# Patient Record
Sex: Male | Born: 1945 | Hispanic: No | Marital: Married | Smoking: Former smoker
Health system: Southern US, Community
[De-identification: ages and names within clinical notes are randomized; demographics above are authoritative.]

---

## 1998-08-18 ENCOUNTER — Other Ambulatory Visit: Admission: RE | Admit: 1998-08-18 | Discharge: 1998-08-18 | Payer: Self-pay | Admitting: Urology

## 2017-11-27 DIAGNOSIS — M25551 Pain in right hip: Secondary | ICD-10-CM | POA: Diagnosis not present

## 2017-11-27 DIAGNOSIS — M79601 Pain in right arm: Secondary | ICD-10-CM | POA: Diagnosis not present

## 2017-11-28 DIAGNOSIS — M25559 Pain in unspecified hip: Secondary | ICD-10-CM | POA: Diagnosis not present

## 2017-11-28 DIAGNOSIS — R319 Hematuria, unspecified: Secondary | ICD-10-CM | POA: Diagnosis not present

## 2017-12-02 DIAGNOSIS — N401 Enlarged prostate with lower urinary tract symptoms: Secondary | ICD-10-CM | POA: Diagnosis not present

## 2017-12-02 DIAGNOSIS — R3912 Poor urinary stream: Secondary | ICD-10-CM | POA: Diagnosis not present

## 2017-12-02 DIAGNOSIS — Z79899 Other long term (current) drug therapy: Secondary | ICD-10-CM | POA: Diagnosis not present

## 2017-12-02 DIAGNOSIS — R310 Gross hematuria: Secondary | ICD-10-CM | POA: Diagnosis not present

## 2017-12-02 DIAGNOSIS — N410 Acute prostatitis: Secondary | ICD-10-CM | POA: Diagnosis not present

## 2017-12-02 DIAGNOSIS — R350 Frequency of micturition: Secondary | ICD-10-CM | POA: Diagnosis not present

## 2017-12-02 DIAGNOSIS — N419 Inflammatory disease of prostate, unspecified: Secondary | ICD-10-CM | POA: Diagnosis not present

## 2017-12-02 DIAGNOSIS — N5089 Other specified disorders of the male genital organs: Secondary | ICD-10-CM | POA: Diagnosis not present

## 2017-12-04 DIAGNOSIS — M79601 Pain in right arm: Secondary | ICD-10-CM | POA: Diagnosis not present

## 2017-12-04 DIAGNOSIS — M25551 Pain in right hip: Secondary | ICD-10-CM | POA: Diagnosis not present

## 2017-12-12 DIAGNOSIS — M25551 Pain in right hip: Secondary | ICD-10-CM | POA: Diagnosis not present

## 2017-12-12 DIAGNOSIS — M79601 Pain in right arm: Secondary | ICD-10-CM | POA: Diagnosis not present

## 2017-12-19 DIAGNOSIS — H43813 Vitreous degeneration, bilateral: Secondary | ICD-10-CM | POA: Diagnosis not present

## 2017-12-19 DIAGNOSIS — H5213 Myopia, bilateral: Secondary | ICD-10-CM | POA: Diagnosis not present

## 2017-12-19 DIAGNOSIS — H25813 Combined forms of age-related cataract, bilateral: Secondary | ICD-10-CM | POA: Diagnosis not present

## 2017-12-19 DIAGNOSIS — H35373 Puckering of macula, bilateral: Secondary | ICD-10-CM | POA: Diagnosis not present

## 2018-07-11 ENCOUNTER — Other Ambulatory Visit: Payer: Self-pay | Admitting: Family Medicine

## 2018-07-11 DIAGNOSIS — Z87891 Personal history of nicotine dependence: Secondary | ICD-10-CM

## 2018-11-07 ENCOUNTER — Other Ambulatory Visit: Payer: Self-pay | Admitting: Family Medicine

## 2018-11-07 DIAGNOSIS — Z87891 Personal history of nicotine dependence: Secondary | ICD-10-CM

## 2019-06-14 ENCOUNTER — Ambulatory Visit: Payer: Medicare PPO | Attending: Internal Medicine

## 2019-06-14 DIAGNOSIS — Z23 Encounter for immunization: Secondary | ICD-10-CM

## 2019-06-14 NOTE — Progress Notes (Signed)
   Covid-19 Vaccination Clinic  Name:  Victor Campbell    MRN: [id number] DOB: [birth year]  06/14/2019  Victor Campbell was observed post Covid-19 immunization for 15 minutes without incidence. He was provided with Vaccine Information Sheet and instruction to access the V-Safe system.   Victor Campbell was instructed to call 911 with any severe reactions post vaccine: Marland Kitchen Difficulty breathing  . Swelling of your face and throat  . A fast heartbeat  . A bad rash all over your body  . Dizziness and weakness    Immunizations Administered    Name Date Dose VIS Date Route   Pfizer COVID-19 Vaccine 06/14/2019  8:31 AM 0.3 mL 04/03/2019 Intramuscular   Manufacturer: [employer], [employer]   Lot: [id number]   NDC: [address]-5

## 2019-07-08 ENCOUNTER — Ambulatory Visit: Payer: Medicare PPO | Attending: Internal Medicine

## 2019-07-08 DIAGNOSIS — Z23 Encounter for immunization: Secondary | ICD-10-CM

## 2019-07-08 NOTE — Progress Notes (Signed)
   Covid-19 Vaccination Clinic  Name:  Teresa Lemmerman    MRN: [id number] DOB: 18-Dec-1945  07/08/2019  Mr. Ra was observed post Covid-19 immunization for 15 minutes without incident. He was provided with Vaccine Information Sheet and instruction to access the V-Safe system.   Mr. Deriso was instructed to call 911 with any severe reactions post vaccine: Marland Kitchen Difficulty breathing  . Swelling of face and throat  . A fast heartbeat  . A bad rash all over body  . Dizziness and weakness   Immunizations Administered    Name Date Dose VIS Date Route   Pfizer COVID-19 Vaccine 07/08/2019  8:26 AM 0.3 mL 04/03/2019 Intramuscular   Manufacturer: [employer], [employer]   Lot: [id number]   NDC: [address]-9

## 2019-10-16 DIAGNOSIS — R319 Hematuria, unspecified: Secondary | ICD-10-CM | POA: Diagnosis not present

## 2019-10-16 DIAGNOSIS — N138 Other obstructive and reflux uropathy: Secondary | ICD-10-CM | POA: Diagnosis not present

## 2019-10-16 DIAGNOSIS — N401 Enlarged prostate with lower urinary tract symptoms: Secondary | ICD-10-CM | POA: Diagnosis not present

## 2019-10-16 DIAGNOSIS — R310 Gross hematuria: Secondary | ICD-10-CM | POA: Diagnosis not present

## 2019-10-16 DIAGNOSIS — R338 Other retention of urine: Secondary | ICD-10-CM | POA: Diagnosis not present

## 2019-10-21 DIAGNOSIS — Z03818 Encounter for observation for suspected exposure to other biological agents ruled out: Secondary | ICD-10-CM | POA: Diagnosis not present

## 2019-11-13 DIAGNOSIS — Z20822 Contact with and (suspected) exposure to covid-19: Secondary | ICD-10-CM | POA: Diagnosis not present

## 2019-11-25 DIAGNOSIS — M25551 Pain in right hip: Secondary | ICD-10-CM | POA: Diagnosis not present

## 2019-11-25 DIAGNOSIS — M25561 Pain in right knee: Secondary | ICD-10-CM | POA: Diagnosis not present

## 2019-11-25 DIAGNOSIS — M79601 Pain in right arm: Secondary | ICD-10-CM | POA: Diagnosis not present

## 2019-11-25 DIAGNOSIS — M25562 Pain in left knee: Secondary | ICD-10-CM | POA: Diagnosis not present

## 2019-12-04 DIAGNOSIS — M25562 Pain in left knee: Secondary | ICD-10-CM | POA: Diagnosis not present

## 2019-12-04 DIAGNOSIS — M25561 Pain in right knee: Secondary | ICD-10-CM | POA: Diagnosis not present

## 2019-12-04 DIAGNOSIS — M25551 Pain in right hip: Secondary | ICD-10-CM | POA: Diagnosis not present

## 2019-12-04 DIAGNOSIS — M79601 Pain in right arm: Secondary | ICD-10-CM | POA: Diagnosis not present

## 2019-12-11 DIAGNOSIS — M79601 Pain in right arm: Secondary | ICD-10-CM | POA: Diagnosis not present

## 2019-12-11 DIAGNOSIS — M25561 Pain in right knee: Secondary | ICD-10-CM | POA: Diagnosis not present

## 2019-12-11 DIAGNOSIS — M25551 Pain in right hip: Secondary | ICD-10-CM | POA: Diagnosis not present

## 2019-12-11 DIAGNOSIS — M25562 Pain in left knee: Secondary | ICD-10-CM | POA: Diagnosis not present

## 2019-12-25 DIAGNOSIS — M25551 Pain in right hip: Secondary | ICD-10-CM | POA: Diagnosis not present

## 2019-12-25 DIAGNOSIS — M25562 Pain in left knee: Secondary | ICD-10-CM | POA: Diagnosis not present

## 2019-12-25 DIAGNOSIS — M25561 Pain in right knee: Secondary | ICD-10-CM | POA: Diagnosis not present

## 2019-12-25 DIAGNOSIS — M79601 Pain in right arm: Secondary | ICD-10-CM | POA: Diagnosis not present

## 2019-12-31 DIAGNOSIS — H31003 Unspecified chorioretinal scars, bilateral: Secondary | ICD-10-CM | POA: Diagnosis not present

## 2019-12-31 DIAGNOSIS — H35373 Puckering of macula, bilateral: Secondary | ICD-10-CM | POA: Diagnosis not present

## 2019-12-31 DIAGNOSIS — H524 Presbyopia: Secondary | ICD-10-CM | POA: Diagnosis not present

## 2019-12-31 DIAGNOSIS — H25813 Combined forms of age-related cataract, bilateral: Secondary | ICD-10-CM | POA: Diagnosis not present

## 2020-01-01 DIAGNOSIS — M25562 Pain in left knee: Secondary | ICD-10-CM | POA: Diagnosis not present

## 2020-01-01 DIAGNOSIS — M25551 Pain in right hip: Secondary | ICD-10-CM | POA: Diagnosis not present

## 2020-01-01 DIAGNOSIS — M25561 Pain in right knee: Secondary | ICD-10-CM | POA: Diagnosis not present

## 2020-01-01 DIAGNOSIS — M79601 Pain in right arm: Secondary | ICD-10-CM | POA: Diagnosis not present

## 2020-01-08 DIAGNOSIS — M25561 Pain in right knee: Secondary | ICD-10-CM | POA: Diagnosis not present

## 2020-01-08 DIAGNOSIS — M25551 Pain in right hip: Secondary | ICD-10-CM | POA: Diagnosis not present

## 2020-01-08 DIAGNOSIS — M79601 Pain in right arm: Secondary | ICD-10-CM | POA: Diagnosis not present

## 2020-01-08 DIAGNOSIS — M25562 Pain in left knee: Secondary | ICD-10-CM | POA: Diagnosis not present

## 2020-01-15 DIAGNOSIS — M79601 Pain in right arm: Secondary | ICD-10-CM | POA: Diagnosis not present

## 2020-01-15 DIAGNOSIS — M25562 Pain in left knee: Secondary | ICD-10-CM | POA: Diagnosis not present

## 2020-01-15 DIAGNOSIS — M25561 Pain in right knee: Secondary | ICD-10-CM | POA: Diagnosis not present

## 2020-01-15 DIAGNOSIS — M25551 Pain in right hip: Secondary | ICD-10-CM | POA: Diagnosis not present

## 2020-01-18 DIAGNOSIS — Z03818 Encounter for observation for suspected exposure to other biological agents ruled out: Secondary | ICD-10-CM | POA: Diagnosis not present

## 2020-01-18 DIAGNOSIS — Z20822 Contact with and (suspected) exposure to covid-19: Secondary | ICD-10-CM | POA: Diagnosis not present

## 2020-02-05 DIAGNOSIS — M25561 Pain in right knee: Secondary | ICD-10-CM | POA: Diagnosis not present

## 2020-02-05 DIAGNOSIS — M79601 Pain in right arm: Secondary | ICD-10-CM | POA: Diagnosis not present

## 2020-02-05 DIAGNOSIS — M25551 Pain in right hip: Secondary | ICD-10-CM | POA: Diagnosis not present

## 2020-02-05 DIAGNOSIS — M25562 Pain in left knee: Secondary | ICD-10-CM | POA: Diagnosis not present

## 2020-02-19 DIAGNOSIS — M79601 Pain in right arm: Secondary | ICD-10-CM | POA: Diagnosis not present

## 2020-02-19 DIAGNOSIS — M25561 Pain in right knee: Secondary | ICD-10-CM | POA: Diagnosis not present

## 2020-02-19 DIAGNOSIS — M25562 Pain in left knee: Secondary | ICD-10-CM | POA: Diagnosis not present

## 2020-02-19 DIAGNOSIS — M25551 Pain in right hip: Secondary | ICD-10-CM | POA: Diagnosis not present

## 2020-02-20 ENCOUNTER — Ambulatory Visit: Payer: Medicare PPO | Attending: Internal Medicine

## 2020-02-20 DIAGNOSIS — Z23 Encounter for immunization: Secondary | ICD-10-CM

## 2020-02-20 NOTE — Progress Notes (Signed)
   Covid-19 Vaccination Clinic  Name:  Victor Campbell    MRN: [id number] DOB: [birth year]  02/20/2020  [name] was observed post Covid-19 immunization for 15 minutes without incident. He was provided with Vaccine Information Sheet and instruction to access the V-Safe system.   [name] was instructed to call 911 with any severe reactions post vaccine: Marland Kitchen Difficulty breathing  . Swelling of face and throat  . A fast heartbeat  . A bad rash all over body  . Dizziness and weakness

## 2020-02-26 DIAGNOSIS — M25561 Pain in right knee: Secondary | ICD-10-CM | POA: Diagnosis not present

## 2020-02-26 DIAGNOSIS — M25551 Pain in right hip: Secondary | ICD-10-CM | POA: Diagnosis not present

## 2020-02-26 DIAGNOSIS — M79601 Pain in right arm: Secondary | ICD-10-CM | POA: Diagnosis not present

## 2020-02-26 DIAGNOSIS — M25562 Pain in left knee: Secondary | ICD-10-CM | POA: Diagnosis not present

## 2020-03-04 DIAGNOSIS — M79601 Pain in right arm: Secondary | ICD-10-CM | POA: Diagnosis not present

## 2020-03-04 DIAGNOSIS — M25562 Pain in left knee: Secondary | ICD-10-CM | POA: Diagnosis not present

## 2020-03-04 DIAGNOSIS — M25551 Pain in right hip: Secondary | ICD-10-CM | POA: Diagnosis not present

## 2020-03-04 DIAGNOSIS — M25561 Pain in right knee: Secondary | ICD-10-CM | POA: Diagnosis not present

## 2020-04-07 DIAGNOSIS — N138 Other obstructive and reflux uropathy: Secondary | ICD-10-CM | POA: Diagnosis not present

## 2020-04-07 DIAGNOSIS — N401 Enlarged prostate with lower urinary tract symptoms: Secondary | ICD-10-CM | POA: Diagnosis not present

## 2020-04-07 DIAGNOSIS — R310 Gross hematuria: Secondary | ICD-10-CM | POA: Diagnosis not present

## 2020-07-15 DIAGNOSIS — I10 Essential (primary) hypertension: Secondary | ICD-10-CM | POA: Diagnosis not present

## 2020-07-15 DIAGNOSIS — E039 Hypothyroidism, unspecified: Secondary | ICD-10-CM | POA: Diagnosis not present

## 2020-07-15 DIAGNOSIS — Z125 Encounter for screening for malignant neoplasm of prostate: Secondary | ICD-10-CM | POA: Diagnosis not present

## 2020-07-15 DIAGNOSIS — E782 Mixed hyperlipidemia: Secondary | ICD-10-CM | POA: Diagnosis not present

## 2020-07-21 DIAGNOSIS — E782 Mixed hyperlipidemia: Secondary | ICD-10-CM | POA: Diagnosis not present

## 2020-07-21 DIAGNOSIS — L719 Rosacea, unspecified: Secondary | ICD-10-CM | POA: Diagnosis not present

## 2020-07-21 DIAGNOSIS — R972 Elevated prostate specific antigen [PSA]: Secondary | ICD-10-CM | POA: Diagnosis not present

## 2020-07-21 DIAGNOSIS — Z0000 Encounter for general adult medical examination without abnormal findings: Secondary | ICD-10-CM | POA: Diagnosis not present

## 2020-07-21 DIAGNOSIS — I10 Essential (primary) hypertension: Secondary | ICD-10-CM | POA: Diagnosis not present

## 2020-07-21 DIAGNOSIS — E039 Hypothyroidism, unspecified: Secondary | ICD-10-CM | POA: Diagnosis not present

## 2020-09-01 DIAGNOSIS — R002 Palpitations: Secondary | ICD-10-CM | POA: Diagnosis not present

## 2020-09-01 DIAGNOSIS — M2550 Pain in unspecified joint: Secondary | ICD-10-CM | POA: Diagnosis not present

## 2020-09-20 DIAGNOSIS — I499 Cardiac arrhythmia, unspecified: Secondary | ICD-10-CM | POA: Diagnosis not present

## 2020-09-21 DIAGNOSIS — R310 Gross hematuria: Secondary | ICD-10-CM | POA: Diagnosis not present

## 2020-09-26 DIAGNOSIS — R350 Frequency of micturition: Secondary | ICD-10-CM | POA: Diagnosis not present

## 2020-09-26 DIAGNOSIS — N401 Enlarged prostate with lower urinary tract symptoms: Secondary | ICD-10-CM | POA: Diagnosis not present

## 2020-09-26 DIAGNOSIS — M25561 Pain in right knee: Secondary | ICD-10-CM | POA: Diagnosis not present

## 2020-09-26 DIAGNOSIS — M25551 Pain in right hip: Secondary | ICD-10-CM | POA: Diagnosis not present

## 2020-09-26 DIAGNOSIS — M79601 Pain in right arm: Secondary | ICD-10-CM | POA: Diagnosis not present

## 2020-09-26 DIAGNOSIS — Z79899 Other long term (current) drug therapy: Secondary | ICD-10-CM | POA: Diagnosis not present

## 2020-09-26 DIAGNOSIS — R310 Gross hematuria: Secondary | ICD-10-CM | POA: Diagnosis not present

## 2020-09-26 DIAGNOSIS — M25562 Pain in left knee: Secondary | ICD-10-CM | POA: Diagnosis not present

## 2020-10-12 DIAGNOSIS — M79601 Pain in right arm: Secondary | ICD-10-CM | POA: Diagnosis not present

## 2020-10-12 DIAGNOSIS — M25562 Pain in left knee: Secondary | ICD-10-CM | POA: Diagnosis not present

## 2020-10-12 DIAGNOSIS — M25561 Pain in right knee: Secondary | ICD-10-CM | POA: Diagnosis not present

## 2020-10-12 DIAGNOSIS — M25551 Pain in right hip: Secondary | ICD-10-CM | POA: Diagnosis not present

## 2020-10-19 DIAGNOSIS — M25551 Pain in right hip: Secondary | ICD-10-CM | POA: Diagnosis not present

## 2020-10-19 DIAGNOSIS — M25561 Pain in right knee: Secondary | ICD-10-CM | POA: Diagnosis not present

## 2020-10-19 DIAGNOSIS — M79601 Pain in right arm: Secondary | ICD-10-CM | POA: Diagnosis not present

## 2020-10-19 DIAGNOSIS — M25562 Pain in left knee: Secondary | ICD-10-CM | POA: Diagnosis not present

## 2020-11-02 DIAGNOSIS — M25551 Pain in right hip: Secondary | ICD-10-CM | POA: Diagnosis not present

## 2020-11-02 DIAGNOSIS — M25561 Pain in right knee: Secondary | ICD-10-CM | POA: Diagnosis not present

## 2020-11-02 DIAGNOSIS — M25562 Pain in left knee: Secondary | ICD-10-CM | POA: Diagnosis not present

## 2020-11-02 DIAGNOSIS — M79601 Pain in right arm: Secondary | ICD-10-CM | POA: Diagnosis not present

## 2020-11-10 DIAGNOSIS — N400 Enlarged prostate without lower urinary tract symptoms: Secondary | ICD-10-CM | POA: Diagnosis not present

## 2020-11-10 DIAGNOSIS — Z9079 Acquired absence of other genital organ(s): Secondary | ICD-10-CM | POA: Diagnosis not present

## 2020-11-10 DIAGNOSIS — Z87438 Personal history of other diseases of male genital organs: Secondary | ICD-10-CM | POA: Diagnosis not present

## 2020-11-10 DIAGNOSIS — N3289 Other specified disorders of bladder: Secondary | ICD-10-CM | POA: Diagnosis not present

## 2020-11-10 DIAGNOSIS — Z09 Encounter for follow-up examination after completed treatment for conditions other than malignant neoplasm: Secondary | ICD-10-CM | POA: Diagnosis not present

## 2020-11-10 DIAGNOSIS — R310 Gross hematuria: Secondary | ICD-10-CM | POA: Diagnosis not present

## 2020-11-18 ENCOUNTER — Telehealth: Payer: Self-pay | Admitting: Interventional Cardiology

## 2020-11-18 NOTE — Telephone Encounter (Signed)
Victor Campbell with [facility] is calling in regards to getting this pt a sooner appt for palpatations advised first available 02/2021 and requested a message be sent to a nurse

## 2020-11-18 NOTE — Telephone Encounter (Signed)
Spoke with Britta Mccreedy at [facility] who states that the patient prefers to see Dr. Eldridge Dace. Advised that there were no openings with him sooner than the patient's already scheduled appointment for 10/5. Advised that we could get him in sooner if he is willing to see another provider. Britta Mccreedy states she will reach out to the patient to see what he wants to do.

## 2021-01-09 ENCOUNTER — Other Ambulatory Visit (INDEPENDENT_AMBULATORY_CARE_PROVIDER_SITE_OTHER): Payer: Medicare PPO

## 2021-01-09 ENCOUNTER — Telehealth: Payer: Self-pay

## 2021-01-09 DIAGNOSIS — R002 Palpitations: Secondary | ICD-10-CM | POA: Diagnosis not present

## 2021-01-09 NOTE — Progress Notes (Unsigned)
Monitor from office inventory applied in office.

## 2021-01-09 NOTE — Telephone Encounter (Signed)
Pt walked in for his 14 day Zio.Marland Kitchen order placed per Dr. Eldridge Dace and pt is having it put on here at the office now.

## 2021-01-12 DIAGNOSIS — H43813 Vitreous degeneration, bilateral: Secondary | ICD-10-CM | POA: Diagnosis not present

## 2021-01-12 DIAGNOSIS — H524 Presbyopia: Secondary | ICD-10-CM | POA: Diagnosis not present

## 2021-01-12 DIAGNOSIS — H35373 Puckering of macula, bilateral: Secondary | ICD-10-CM | POA: Diagnosis not present

## 2021-01-12 DIAGNOSIS — H2513 Age-related nuclear cataract, bilateral: Secondary | ICD-10-CM | POA: Diagnosis not present

## 2021-01-19 DIAGNOSIS — R002 Palpitations: Secondary | ICD-10-CM | POA: Diagnosis not present

## 2021-01-25 ENCOUNTER — Encounter: Payer: Self-pay | Admitting: Interventional Cardiology

## 2021-01-25 ENCOUNTER — Other Ambulatory Visit: Payer: Self-pay

## 2021-01-25 ENCOUNTER — Ambulatory Visit: Payer: Medicare PPO | Admitting: Interventional Cardiology

## 2021-01-25 VITALS — BP 114/80 | HR 70 | Ht 71.0 in | Wt 184.8 lb

## 2021-01-25 DIAGNOSIS — R002 Palpitations: Secondary | ICD-10-CM

## 2021-01-25 DIAGNOSIS — I491 Atrial premature depolarization: Secondary | ICD-10-CM

## 2021-01-25 NOTE — Patient Instructions (Signed)
Medication Instructions:  Your physician recommends that you continue on your current medications as directed. Please refer to the Current Medication list given to you today.  *If you need a refill on your cardiac medications before your next appointment, please call your pharmacy*   Lab Work: none If you have labs (blood work) drawn today and your tests are completely normal, you will receive your results only by: MyChart Message (if you have MyChart) OR A paper copy in the mail If you have any lab test that is abnormal or we need to change your treatment, we will call you to review the results.   Testing/Procedures: Dr Eldridge Dace recommends you have a Calcium Score CT scan   Follow-Up: At [facility], you and your health needs are our priority.  As part of our continuing mission to provide you with exceptional heart care, we have created designated Provider Care Teams.  These Care Teams include your primary Cardiologist (physician) and Advanced Practice Providers (APPs -  Physician Assistants and Nurse Practitioners) who all work together to provide you with the care you need, when you need it.  We recommend signing up for the patient portal called "MyChart".  Sign up information is provided on this After Visit Summary.  MyChart is used to connect with patients for Virtual Visits (Telemedicine).  Patients are able to view lab/test results, encounter notes, upcoming appointments, etc.  Non-urgent messages can be sent to your provider as well.   To learn more about what you can do with MyChart, go to [email or web address].    Your next appointment:   As needed  The format for your next appointment:   In Person  Provider:   You may see Lance Muss, MD or one of the following Advanced Practice Providers on your designated Care Team:   Ronie Spies, PA-C Jacolyn Reedy, PA-C   Other Instructions

## 2021-01-25 NOTE — Progress Notes (Signed)
Cardiology Office Note   Date:  01/25/2021   ID:  Kazuto, Sevey 10-15-1945, MRN [id number]  PCP:  Lawerance Cruel, MD    No chief complaint on file.  palpitations  Wt Readings from Last 3 Encounters:  01/25/21 184 lb 12.8 oz (83.8 kg)       History of Present Illness: Victor Campbell is a 75 y.o. male who is being seen today for the evaluation of palpitations at the request of Lawerance Cruel, MD.   Had palpitations since April of 2022.  No syncope.  Denies : exertional Chest pain. Dizziness. Leg edema. Nitroglycerin use. Orthopnea. Palpitations. Paroxysmal nocturnal dyspnea. Shortness of breath. Syncope.    Monitor in September 2022: " Normal sinus rhythm with rare PVCs and occasional PACs.  Symptoms of palpitations correlated to PACs.  Short, brief runs of PACs.  No atrial fibrillation.  No sustained arrhtyhmias.   Benign etiology of palpitations. "  Brother died of COVID in 07/01/2018.  Walks for exercise, tai chi at [facility].  No sx.  Intensity has decreased.   Denies : Chest pain. Dizziness. Leg edema. Nitroglycerin use. Orthopnea. Paroxysmal nocturnal dyspnea. Shortness of breath. Syncope.     No past medical history on file.     Current Outpatient Medications  Medication Sig Dispense Refill   glucosamine-chondroitin 500-400 MG tablet Take by mouth.     levothyroxine (SYNTHROID) 88 MCG tablet Take by mouth.     Multiple Vitamin (MULTI-VITAMIN) tablet Take 1 tablet by mouth daily.     Omega-3 1000 MG CAPS Take by mouth.     No current facility-administered medications for this visit.    Allergies:   Penicillins    Social History:  The patient  reports that he has quit smoking. His smoking use included cigarettes. He has never been exposed to tobacco smoke. He has never used smokeless tobacco.   Family History:  The patient's family history includes Hyperlipidemia in his brother; Hypertension in his brother.    ROS:  Please see the  history of present illness.   Otherwise, review of systems are positive for stress since brother's death 2 years ago and mother's death 1 year ago.   All other systems are reviewed and negative.    PHYSICAL EXAM: VS:  BP 114/80   Pulse 70   Ht _0  (1.803 m)   Wt 184 lb 12.8 oz (83.8 kg)   SpO2 97%   BMI 25.77 kg/m  , BMI Body mass index is 25.77 kg/m. GEN: Well nourished, well developed, in no acute distress HEENT: normal Neck: no JVD, carotid bruits, or masses Cardiac: RRR, premature beats; no murmurs, rubs, or gallops,no edema  Respiratory:  clear to auscultation bilaterally, normal work of breathing GI: soft, nontender, nondistended, + BS MS: no deformity or atrophy Skin: warm and dry, no rash Neuro:  Strength and sensation are intact Psych: euthymic mood, full affect   EKG:   The ekg ordered today demonstrates NSR, no ST segment changes   Recent Labs: No results found for requested labs within last 8760 hours.   Lipid Panel No results found for: CHOL, TRIG, HDL, CHOLHDL, VLDL, LDLCALC, LDLDIRECT   Other studies Reviewed: Additional studies/ records that were reviewed today with results demonstrating: Labs reviewed, LDL 112.   ASSESSMENT AND PLAN:  PACs: Symptoms clearly correlate to PACs.  He had some during the exam and could identify them.  I explained to him that they are benign.  His exam is  otherwise normal. Plan for calcium scoring CT to screen for coronary artery disease.  LDL is 112.  Would consider statin if his calcium score was high.  He will do this when he comes back from a trip overseas.  He is leaving tomorrow. Continue regular exercise.  He has had a lot of stress with several deaths in the family over the last couple of years.  He is heading to [place] to see his family.  He thinks this may be adding to his anxiety.   Current medicines are reviewed at length with the patient today.  The patient concerns regarding his medicines were addressed.  The  following changes have been made:  No change  Labs/ tests ordered today include:  No orders of the defined types were placed in this encounter.   Recommend 150 minutes/week of aerobic exercise Low fat, low carb, high fiber diet recommended  Disposition:   FU based on calcium scoring test result   Signed, Larae Grooms, MD  01/25/2021 9:44 AM    [facility] [place], [place], [place]  [address] Phone: [phone]; Fax: [phone]

## 2021-01-26 NOTE — Addendum Note (Signed)
Addended by: Adele Schilder on: 01/26/2021 02:43 PM   Modules accepted: Orders

## 2021-02-21 ENCOUNTER — Telehealth: Payer: Self-pay | Admitting: *Deleted

## 2021-02-21 DIAGNOSIS — R002 Palpitations: Secondary | ICD-10-CM

## 2021-02-21 DIAGNOSIS — I491 Atrial premature depolarization: Secondary | ICD-10-CM

## 2021-02-21 NOTE — Telephone Encounter (Signed)
New Calcium score order placed for this pt as requested by our CT dept.  Ordered by Dr. Eldridge Dace.  Will make CT dept order placed with associated diagnosis, so CT dept can arrange this appt for the pt.

## 2021-02-21 NOTE — Telephone Encounter (Signed)
-----   Message from Fabiola Backer, [place] sent at 02/21/2021  1:02 PM EDT ----- Regarding: Missing Diagnosis code Can someone please add a diagnosis code onto this scheduled calcium score?

## 2021-03-09 ENCOUNTER — Inpatient Hospital Stay: Admission: RE | Admit: 2021-03-09 | Payer: Medicare PPO | Source: Ambulatory Visit

## 2021-03-09 ENCOUNTER — Telehealth: Payer: Self-pay | Admitting: Interventional Cardiology

## 2021-03-09 ENCOUNTER — Other Ambulatory Visit: Payer: Self-pay

## 2021-03-09 ENCOUNTER — Ambulatory Visit (INDEPENDENT_AMBULATORY_CARE_PROVIDER_SITE_OTHER)
Admission: RE | Admit: 2021-03-09 | Discharge: 2021-03-09 | Disposition: A | Discharge status: Home / Self Care | Payer: Self-pay | Source: Ambulatory Visit | Attending: Interventional Cardiology | Admitting: Interventional Cardiology

## 2021-03-09 DIAGNOSIS — I77819 Aortic ectasia, unspecified site: Secondary | ICD-10-CM

## 2021-03-09 DIAGNOSIS — R002 Palpitations: Secondary | ICD-10-CM

## 2021-03-09 DIAGNOSIS — I491 Atrial premature depolarization: Secondary | ICD-10-CM

## 2021-03-09 NOTE — Telephone Encounter (Signed)
He did have a mildly dilated aortaon the CT scan.  SHould repeat imaging in 6 months with CTA.

## 2021-03-10 NOTE — Telephone Encounter (Signed)
-----   Message from Corky Crafts, MD sent at 03/09/2021  4:03 PM EST ----- Calcium score is low, but not 0.  WOuld start rosuvastatin 10 mg daily to get LDL < 100.  Repeat lipids and liver tests in 3 months. COntinue with regular exercise and healthy diet as well.

## 2021-03-10 NOTE — Telephone Encounter (Signed)
Left message to call office

## 2021-03-14 ENCOUNTER — Encounter: Payer: Self-pay | Admitting: Interventional Cardiology

## 2021-03-14 NOTE — Telephone Encounter (Signed)
Left message to call office. Result comments have been sent to patient through my chart.

## 2021-03-24 NOTE — Telephone Encounter (Signed)
See my chart messages to patient. CTA ordered for June 2023

## 2021-06-29 DIAGNOSIS — I10 Essential (primary) hypertension: Secondary | ICD-10-CM | POA: Diagnosis not present

## 2021-06-29 DIAGNOSIS — M25511 Pain in right shoulder: Secondary | ICD-10-CM | POA: Diagnosis not present

## 2021-07-05 DIAGNOSIS — M25511 Pain in right shoulder: Secondary | ICD-10-CM | POA: Diagnosis not present

## 2021-07-13 DIAGNOSIS — N401 Enlarged prostate with lower urinary tract symptoms: Secondary | ICD-10-CM | POA: Diagnosis not present

## 2021-07-13 DIAGNOSIS — R3915 Urgency of urination: Secondary | ICD-10-CM | POA: Diagnosis not present

## 2021-07-13 DIAGNOSIS — R972 Elevated prostate specific antigen [PSA]: Secondary | ICD-10-CM | POA: Diagnosis not present

## 2021-07-14 DIAGNOSIS — M25511 Pain in right shoulder: Secondary | ICD-10-CM | POA: Diagnosis not present

## 2021-07-24 DIAGNOSIS — M25511 Pain in right shoulder: Secondary | ICD-10-CM | POA: Diagnosis not present

## 2021-08-02 DIAGNOSIS — E782 Mixed hyperlipidemia: Secondary | ICD-10-CM | POA: Diagnosis not present

## 2021-08-02 DIAGNOSIS — I10 Essential (primary) hypertension: Secondary | ICD-10-CM | POA: Diagnosis not present

## 2021-08-02 DIAGNOSIS — Z125 Encounter for screening for malignant neoplasm of prostate: Secondary | ICD-10-CM | POA: Diagnosis not present

## 2021-08-02 DIAGNOSIS — E039 Hypothyroidism, unspecified: Secondary | ICD-10-CM | POA: Diagnosis not present

## 2021-08-04 DIAGNOSIS — M25511 Pain in right shoulder: Secondary | ICD-10-CM | POA: Diagnosis not present

## 2021-08-07 DIAGNOSIS — Z1389 Encounter for screening for other disorder: Secondary | ICD-10-CM | POA: Diagnosis not present

## 2021-08-07 DIAGNOSIS — R944 Abnormal results of kidney function studies: Secondary | ICD-10-CM | POA: Diagnosis not present

## 2021-08-07 DIAGNOSIS — Z0000 Encounter for general adult medical examination without abnormal findings: Secondary | ICD-10-CM | POA: Diagnosis not present

## 2021-08-07 DIAGNOSIS — E039 Hypothyroidism, unspecified: Secondary | ICD-10-CM | POA: Diagnosis not present

## 2021-08-07 DIAGNOSIS — L719 Rosacea, unspecified: Secondary | ICD-10-CM | POA: Diagnosis not present

## 2021-08-07 DIAGNOSIS — E782 Mixed hyperlipidemia: Secondary | ICD-10-CM | POA: Diagnosis not present

## 2021-08-10 DIAGNOSIS — R972 Elevated prostate specific antigen [PSA]: Secondary | ICD-10-CM | POA: Diagnosis not present

## 2021-08-10 DIAGNOSIS — N138 Other obstructive and reflux uropathy: Secondary | ICD-10-CM | POA: Diagnosis not present

## 2021-08-10 DIAGNOSIS — N401 Enlarged prostate with lower urinary tract symptoms: Secondary | ICD-10-CM | POA: Diagnosis not present

## 2021-08-11 DIAGNOSIS — M25511 Pain in right shoulder: Secondary | ICD-10-CM | POA: Diagnosis not present

## 2021-09-05 DIAGNOSIS — N138 Other obstructive and reflux uropathy: Secondary | ICD-10-CM | POA: Diagnosis not present

## 2021-09-05 DIAGNOSIS — N401 Enlarged prostate with lower urinary tract symptoms: Secondary | ICD-10-CM | POA: Diagnosis not present

## 2021-09-25 DIAGNOSIS — N138 Other obstructive and reflux uropathy: Secondary | ICD-10-CM | POA: Diagnosis not present

## 2021-09-25 DIAGNOSIS — N401 Enlarged prostate with lower urinary tract symptoms: Secondary | ICD-10-CM | POA: Diagnosis not present

## 2021-10-02 DIAGNOSIS — S80861A Insect bite (nonvenomous), right lower leg, initial encounter: Secondary | ICD-10-CM | POA: Diagnosis not present

## 2021-10-02 DIAGNOSIS — S80851A Superficial foreign body, right lower leg, initial encounter: Secondary | ICD-10-CM | POA: Diagnosis not present

## 2021-10-02 DIAGNOSIS — W57XXXA Bitten or stung by nonvenomous insect and other nonvenomous arthropods, initial encounter: Secondary | ICD-10-CM | POA: Diagnosis not present

## 2021-10-06 ENCOUNTER — Ambulatory Visit (INDEPENDENT_AMBULATORY_CARE_PROVIDER_SITE_OTHER)
Admission: RE | Admit: 2021-10-06 | Discharge: 2021-10-06 | Disposition: A | Discharge status: Home / Self Care | Payer: Medicare PPO | Source: Ambulatory Visit | Attending: Interventional Cardiology | Admitting: Interventional Cardiology

## 2021-10-06 DIAGNOSIS — I773 Arterial fibromuscular dysplasia: Secondary | ICD-10-CM | POA: Diagnosis not present

## 2021-10-06 DIAGNOSIS — I77819 Aortic ectasia, unspecified site: Secondary | ICD-10-CM | POA: Diagnosis not present

## 2021-10-06 DIAGNOSIS — I712 Thoracic aortic aneurysm, without rupture, unspecified: Secondary | ICD-10-CM | POA: Diagnosis not present

## 2021-10-06 MED ORDER — IOHEXOL 350 MG/ML SOLN
100.0000 mL | Freq: Once | INTRAVENOUS | Status: AC | PRN
  Administered 2021-10-06: 100 mL via INTRAVENOUS

## 2021-10-10 ENCOUNTER — Other Ambulatory Visit: Payer: Self-pay | Admitting: *Deleted

## 2021-10-10 DIAGNOSIS — Z79890 Hormone replacement therapy (postmenopausal): Secondary | ICD-10-CM | POA: Diagnosis not present

## 2021-10-10 DIAGNOSIS — N3289 Other specified disorders of bladder: Secondary | ICD-10-CM | POA: Diagnosis not present

## 2021-10-10 DIAGNOSIS — N138 Other obstructive and reflux uropathy: Secondary | ICD-10-CM | POA: Diagnosis not present

## 2021-10-10 DIAGNOSIS — J45909 Unspecified asthma, uncomplicated: Secondary | ICD-10-CM | POA: Diagnosis not present

## 2021-10-10 DIAGNOSIS — N401 Enlarged prostate with lower urinary tract symptoms: Secondary | ICD-10-CM | POA: Diagnosis not present

## 2021-10-10 DIAGNOSIS — Z79899 Other long term (current) drug therapy: Secondary | ICD-10-CM | POA: Diagnosis not present

## 2021-10-10 DIAGNOSIS — I77819 Aortic ectasia, unspecified site: Secondary | ICD-10-CM

## 2021-10-10 DIAGNOSIS — Z87891 Personal history of nicotine dependence: Secondary | ICD-10-CM | POA: Diagnosis not present

## 2021-10-10 DIAGNOSIS — Z9889 Other specified postprocedural states: Secondary | ICD-10-CM | POA: Diagnosis not present

## 2021-10-10 DIAGNOSIS — I10 Essential (primary) hypertension: Secondary | ICD-10-CM | POA: Diagnosis not present

## 2021-10-11 DIAGNOSIS — N401 Enlarged prostate with lower urinary tract symptoms: Secondary | ICD-10-CM | POA: Diagnosis not present

## 2021-10-11 DIAGNOSIS — J45909 Unspecified asthma, uncomplicated: Secondary | ICD-10-CM | POA: Diagnosis not present

## 2021-10-11 DIAGNOSIS — Z9889 Other specified postprocedural states: Secondary | ICD-10-CM | POA: Diagnosis not present

## 2021-10-11 DIAGNOSIS — N138 Other obstructive and reflux uropathy: Secondary | ICD-10-CM | POA: Diagnosis not present

## 2021-10-11 DIAGNOSIS — N3289 Other specified disorders of bladder: Secondary | ICD-10-CM | POA: Diagnosis not present

## 2021-10-11 DIAGNOSIS — Z87891 Personal history of nicotine dependence: Secondary | ICD-10-CM | POA: Diagnosis not present

## 2021-10-11 DIAGNOSIS — Z79890 Hormone replacement therapy (postmenopausal): Secondary | ICD-10-CM | POA: Diagnosis not present

## 2021-10-11 DIAGNOSIS — I10 Essential (primary) hypertension: Secondary | ICD-10-CM | POA: Diagnosis not present

## 2021-10-11 DIAGNOSIS — Z79899 Other long term (current) drug therapy: Secondary | ICD-10-CM | POA: Diagnosis not present

## 2022-01-01 DIAGNOSIS — R339 Retention of urine, unspecified: Secondary | ICD-10-CM | POA: Diagnosis not present

## 2022-01-01 DIAGNOSIS — N401 Enlarged prostate with lower urinary tract symptoms: Secondary | ICD-10-CM | POA: Diagnosis not present

## 2022-01-01 DIAGNOSIS — N138 Other obstructive and reflux uropathy: Secondary | ICD-10-CM | POA: Diagnosis not present

## 2022-01-04 DIAGNOSIS — E663 Overweight: Secondary | ICD-10-CM | POA: Diagnosis not present

## 2022-01-04 DIAGNOSIS — H9201 Otalgia, right ear: Secondary | ICD-10-CM | POA: Diagnosis not present

## 2022-01-10 DIAGNOSIS — H5711 Ocular pain, right eye: Secondary | ICD-10-CM | POA: Diagnosis not present

## 2022-01-17 DIAGNOSIS — H5213 Myopia, bilateral: Secondary | ICD-10-CM | POA: Diagnosis not present

## 2022-01-17 DIAGNOSIS — H2512 Age-related nuclear cataract, left eye: Secondary | ICD-10-CM | POA: Diagnosis not present

## 2022-01-17 DIAGNOSIS — H35373 Puckering of macula, bilateral: Secondary | ICD-10-CM | POA: Diagnosis not present

## 2022-01-17 DIAGNOSIS — H43813 Vitreous degeneration, bilateral: Secondary | ICD-10-CM | POA: Diagnosis not present

## 2022-01-17 DIAGNOSIS — H524 Presbyopia: Secondary | ICD-10-CM | POA: Diagnosis not present

## 2022-01-17 DIAGNOSIS — H31003 Unspecified chorioretinal scars, bilateral: Secondary | ICD-10-CM | POA: Diagnosis not present

## 2022-02-21 DIAGNOSIS — H903 Sensorineural hearing loss, bilateral: Secondary | ICD-10-CM | POA: Diagnosis not present

## 2022-05-28 DIAGNOSIS — R339 Retention of urine, unspecified: Secondary | ICD-10-CM | POA: Diagnosis not present

## 2022-05-28 DIAGNOSIS — N401 Enlarged prostate with lower urinary tract symptoms: Secondary | ICD-10-CM | POA: Diagnosis not present

## 2022-05-28 DIAGNOSIS — N138 Other obstructive and reflux uropathy: Secondary | ICD-10-CM | POA: Diagnosis not present

## 2022-05-30 NOTE — Progress Notes (Unsigned)
Cardiology Office Note   Date:  05/31/2022   ID:  Shep, Porter [birth year], MRN [id number]  PCP:  Lawerance Cruel, MD    No chief complaint on file.  PAC, PVC  Wt Readings from Last 3 Encounters:  05/31/22 190 lb (86.2 kg)  01/25/21 184 lb 12.8 oz (83.8 kg)       History of Present Illness: Victor Campbell is a 77 y.o. male  who I saw in 08/15/2020 for palpitations.    "Monitor in September 2022: " Normal sinus rhythm with rare PVCs and occasional PACs.  Symptoms of palpitations correlated to PACs.  Short, brief runs of PACs.  No atrial fibrillation.  No sustained arrhtyhmias.   Benign etiology of palpitations. "   Brother died of COVID in 08-16-18.   Walks for exercise, tai chi at [facility] [employer].  No sx.  Intensity has decreased. "  November 2022 calcium scoring CT:  "Coronary calcium score of 7.6. This was 14 percentile for age-, race-, and sex-matched controls.   2. 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 Consider contrast CTA for further clarification."  Home blood Pressure readings are running in the 120-130s/ 70-80's.  Denies : Chest pain.  Leg edema. Nitroglycerin use. Orthopnea. Palpitations. Paroxysmal nocturnal dyspnea. Shortness of breath. Syncope.   Still walking and doing tai chi. Works with [employer].  Hiking and warmer weather.  History reviewed. No pertinent past medical history.  History reviewed. No pertinent surgical history.   Current Outpatient Medications  Medication Sig Dispense Refill   glucosamine-chondroitin 500-400 MG tablet Take by mouth.     levothyroxine (SYNTHROID) 100 MCG tablet Take 100 mcg by mouth every morning.     metroNIDAZOLE (METROCREAM) 0.75 % cream Apply topically 2 (two) times daily.     Multiple Vitamin (MULTI-VITAMIN) tablet Take 1 tablet by mouth daily.     Omega-3 1000 MG CAPS Take by mouth.     No current facility-administered medications for this visit.    Allergies:   Penicillins    Social History:   The patient  reports that he has quit smoking. His smoking use included cigarettes. He has never been exposed to tobacco smoke. He has never used smokeless tobacco.   Family History:  The patient's family history includes Hyperlipidemia in his brother; Hypertension in his brother.    ROS:  Please see the history of present illness.   Otherwise, review of systems are positive for dizziness rarely.   All other systems are reviewed and negative.    PHYSICAL EXAM: VS:  BP (!) 146/76   Pulse 68   Ht 5\' 11"  (1.803 m)   Wt 190 lb (86.2 kg)   SpO2 96%   BMI 26.50 kg/m  , BMI Body mass index is 26.5 kg/m. GEN: Well nourished, well developed, in no acute distress HEENT: normal Neck: no JVD, carotid bruits, or masses Cardiac: RRR; no murmurs, rubs, or gallops,no edema  Respiratory:  clear to auscultation bilaterally, normal work of breathing GI: soft, nontender, nondistended, + BS MS: no deformity or atrophy Skin: warm and dry, no rash Neuro:  Strength and sensation are intact Psych: euthymic mood, full affect   EKG:   The ekg ordered today demonstrates NSR, no ST changes   Recent Labs: No results found for requested labs within last 365 days.   Lipid Panel No results found for: "CHOL", "TRIG", "HDL", "CHOLHDL", "VLDL", "LDLCALC", "LDLDIRECT"   Other studies Reviewed: Additional studies/ records that were reviewed today with results  demonstrating: CT Greatest estimated diameter of the ascending aorta 4.2 cm. .   ASSESSMENT AND PLAN:  PACs: Occasional palpitations.  No sustained palpitations. 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  Blood pressure rechecked today in the office and was in the 213Y systolic.  It has been higher in the doctor's office in the past as well.  Continue to check at home.  If blood pressure is elevated, he will let us know and we  could consider blood pressure medications.  He really wants to try to avoid medicine if possible. Coronary artery calcification: no angina.  Prefers to avoid statin.  14th %ile.  Whole food, plant-based diet.  High-fiber diet.  Avoid processed foods. Dilated aorta: June 2023 CT scan showed:Greatest estimated diameter of the ascending aorta 4.2 cm.  Repeat CTA in June or July 2024. Hypothyroidism: Followed by primary care doctor.  Dose increased.  Has pill box to improve compliance.      Current medicines are reviewed at length with the patient today.  The patient concerns regarding his medicines were addressed.  The following changes have been made:  No change  Labs/ tests ordered today include:  No orders of the defined types were placed in this encounter.   Recommend 150 minutes/week of aerobic exercise Low fat, low carb, high fiber diet recommended  Disposition:   FU in 1 year   Signed, Larae Grooms, MD  05/31/2022 9:12 AM    [facility], [place], [place]  [address] Phone: [phone]; Fax: [phone]

## 2022-05-31 ENCOUNTER — Encounter: Payer: Self-pay | Admitting: Interventional Cardiology

## 2022-05-31 ENCOUNTER — Ambulatory Visit: Payer: Medicare PPO | Attending: Interventional Cardiology | Admitting: Interventional Cardiology

## 2022-05-31 VITALS — BP 146/76 | HR 68 | Ht 71.0 in | Wt 190.0 lb

## 2022-05-31 DIAGNOSIS — R002 Palpitations: Secondary | ICD-10-CM

## 2022-05-31 DIAGNOSIS — I491 Atrial premature depolarization: Secondary | ICD-10-CM | POA: Diagnosis not present

## 2022-05-31 DIAGNOSIS — I77819 Aortic ectasia, unspecified site: Secondary | ICD-10-CM | POA: Diagnosis not present

## 2022-05-31 NOTE — Patient Instructions (Signed)
Medication Instructions:  Your physician recommends that you continue on your current medications as directed. Please refer to the Current Medication list given to you today.  *If you need a refill on your cardiac medications before your next appointment, please call your pharmacy*   Lab Work: Your physician recommends that you return for lab work in: June --week or so prior to CTA  If you have labs (blood work) drawn today and your tests are completely normal, you will receive your results only by: Burna (if you have MyChart) OR A paper copy in the mail If you have any lab test that is abnormal or we need to change your treatment, we will call you to review the results.   Testing/Procedures: Non-Cardiac CT Angiography (CTA), is a special type of CT scan that uses a computer to produce multi-dimensional views of major blood vessels throughout the body. In CT angiography, a contrast material is injected through an IV to help visualize the blood vessels  To be done at [facility] in June   Follow-Up: At [facility], you and your health needs are our priority.  As part of our continuing mission to provide you with exceptional heart care, we have created designated Provider Care Teams.  These Care Teams include your primary Cardiologist (physician) and Advanced Practice Providers (APPs -  Physician Assistants and Nurse Practitioners) who all work together to provide you with the care you need, when you need it.  We recommend signing up for the patient portal called "MyChart".  Sign up information is provided on this After Visit Summary.  MyChart is used to connect with patients for Virtual Visits (Telemedicine).  Patients are able to view lab/test results, encounter notes, upcoming appointments, etc.  Non-urgent messages can be sent to your provider as well.   To learn more about what you can do with MyChart, go to [email or web address].    Your next appointment:    12 month(s)  Provider:   Larae Grooms, MD     Other Instructions

## 2022-08-09 DIAGNOSIS — E782 Mixed hyperlipidemia: Secondary | ICD-10-CM | POA: Diagnosis not present

## 2022-08-09 DIAGNOSIS — Z125 Encounter for screening for malignant neoplasm of prostate: Secondary | ICD-10-CM | POA: Diagnosis not present

## 2022-08-09 DIAGNOSIS — R944 Abnormal results of kidney function studies: Secondary | ICD-10-CM | POA: Diagnosis not present

## 2022-08-09 DIAGNOSIS — E039 Hypothyroidism, unspecified: Secondary | ICD-10-CM | POA: Diagnosis not present

## 2022-08-14 DIAGNOSIS — R030 Elevated blood-pressure reading, without diagnosis of hypertension: Secondary | ICD-10-CM | POA: Diagnosis not present

## 2022-08-14 DIAGNOSIS — E039 Hypothyroidism, unspecified: Secondary | ICD-10-CM | POA: Diagnosis not present

## 2022-08-14 DIAGNOSIS — L719 Rosacea, unspecified: Secondary | ICD-10-CM | POA: Diagnosis not present

## 2022-08-14 DIAGNOSIS — Z0000 Encounter for general adult medical examination without abnormal findings: Secondary | ICD-10-CM | POA: Diagnosis not present

## 2022-08-14 DIAGNOSIS — I77819 Aortic ectasia, unspecified site: Secondary | ICD-10-CM | POA: Diagnosis not present

## 2022-08-14 DIAGNOSIS — I700 Atherosclerosis of aorta: Secondary | ICD-10-CM | POA: Diagnosis not present

## 2022-08-14 DIAGNOSIS — Z6827 Body mass index (BMI) 27.0-27.9, adult: Secondary | ICD-10-CM | POA: Diagnosis not present

## 2022-08-14 DIAGNOSIS — Z125 Encounter for screening for malignant neoplasm of prostate: Secondary | ICD-10-CM | POA: Diagnosis not present

## 2022-08-14 DIAGNOSIS — E782 Mixed hyperlipidemia: Secondary | ICD-10-CM | POA: Diagnosis not present

## 2022-10-03 ENCOUNTER — Other Ambulatory Visit: Payer: Medicare PPO

## 2022-10-05 ENCOUNTER — Ambulatory Visit: Payer: Medicare PPO | Attending: Interventional Cardiology

## 2022-10-05 DIAGNOSIS — I491 Atrial premature depolarization: Secondary | ICD-10-CM

## 2022-10-05 DIAGNOSIS — R002 Palpitations: Secondary | ICD-10-CM

## 2022-10-05 DIAGNOSIS — I77819 Aortic ectasia, unspecified site: Secondary | ICD-10-CM

## 2022-10-06 LAB — BASIC METABOLIC PANEL
BUN/Creatinine Ratio: 17 (ref 10–24)
BUN: 18 mg/dL (ref 8–27)
CO2: 22 mmol/L (ref 20–29)
Calcium: 9.4 mg/dL (ref 8.6–10.2)
Chloride: 106 mmol/L (ref 96–106)
Creatinine, Ser: 1.04 mg/dL (ref 0.76–1.27)
Glucose: 90 mg/dL (ref 70–99)
Potassium: 4.3 mmol/L (ref 3.5–5.2)
Sodium: 142 mmol/L (ref 134–144)
eGFR: 75 mL/min/{1.73_m2} (ref >59)

## 2022-10-10 ENCOUNTER — Ambulatory Visit
Admission: RE | Admit: 2022-10-10 | Discharge: 2022-10-10 | Disposition: A | Discharge status: Home / Self Care | Payer: Medicare PPO | Source: Ambulatory Visit | Attending: Interventional Cardiology | Admitting: Interventional Cardiology

## 2022-10-10 DIAGNOSIS — I711 Thoracic aortic aneurysm, ruptured, unspecified: Secondary | ICD-10-CM | POA: Diagnosis not present

## 2022-10-10 DIAGNOSIS — I77819 Aortic ectasia, unspecified site: Secondary | ICD-10-CM

## 2022-10-10 MED ORDER — IOPAMIDOL (ISOVUE-370) INJECTION 76%
75.0000 mL | Freq: Once | INTRAVENOUS | Status: AC | PRN
  Administered 2022-10-10: 75 mL via INTRAVENOUS

## 2022-10-24 ENCOUNTER — Other Ambulatory Visit: Payer: Self-pay | Admitting: *Deleted

## 2022-10-24 DIAGNOSIS — I712 Thoracic aortic aneurysm, without rupture, unspecified: Secondary | ICD-10-CM

## 2022-10-24 DIAGNOSIS — I77819 Aortic ectasia, unspecified site: Secondary | ICD-10-CM

## 2023-02-22 DIAGNOSIS — H903 Sensorineural hearing loss, bilateral: Secondary | ICD-10-CM | POA: Diagnosis not present

## 2023-04-30 DIAGNOSIS — H35373 Puckering of macula, bilateral: Secondary | ICD-10-CM | POA: Diagnosis not present

## 2023-04-30 DIAGNOSIS — H43813 Vitreous degeneration, bilateral: Secondary | ICD-10-CM | POA: Diagnosis not present

## 2023-04-30 DIAGNOSIS — H524 Presbyopia: Secondary | ICD-10-CM | POA: Diagnosis not present

## 2023-04-30 DIAGNOSIS — H04123 Dry eye syndrome of bilateral lacrimal glands: Secondary | ICD-10-CM | POA: Diagnosis not present

## 2023-04-30 DIAGNOSIS — H5213 Myopia, bilateral: Secondary | ICD-10-CM | POA: Diagnosis not present

## 2023-04-30 DIAGNOSIS — H2513 Age-related nuclear cataract, bilateral: Secondary | ICD-10-CM | POA: Diagnosis not present

## 2023-04-30 DIAGNOSIS — H31003 Unspecified chorioretinal scars, bilateral: Secondary | ICD-10-CM | POA: Diagnosis not present

## 2023-04-30 DIAGNOSIS — H52203 Unspecified astigmatism, bilateral: Secondary | ICD-10-CM | POA: Diagnosis not present

## 2023-04-30 DIAGNOSIS — H25013 Cortical age-related cataract, bilateral: Secondary | ICD-10-CM | POA: Diagnosis not present

## 2023-06-26 ENCOUNTER — Ambulatory Visit: Payer: Medicare PPO | Attending: Cardiovascular Disease | Admitting: Cardiovascular Disease

## 2023-06-26 ENCOUNTER — Encounter: Payer: Self-pay | Admitting: Cardiovascular Disease

## 2023-06-26 VITALS — BP 138/86 | HR 85 | Ht 70.0 in | Wt 200.0 lb

## 2023-06-26 DIAGNOSIS — E782 Mixed hyperlipidemia: Secondary | ICD-10-CM | POA: Diagnosis not present

## 2023-06-26 DIAGNOSIS — I491 Atrial premature depolarization: Secondary | ICD-10-CM | POA: Diagnosis not present

## 2023-06-26 DIAGNOSIS — R002 Palpitations: Secondary | ICD-10-CM | POA: Diagnosis not present

## 2023-06-26 DIAGNOSIS — I712 Thoracic aortic aneurysm, without rupture, unspecified: Secondary | ICD-10-CM | POA: Insufficient documentation

## 2023-06-26 DIAGNOSIS — I77819 Aortic ectasia, unspecified site: Secondary | ICD-10-CM

## 2023-06-26 DIAGNOSIS — E785 Hyperlipidemia, unspecified: Secondary | ICD-10-CM | POA: Insufficient documentation

## 2023-06-26 NOTE — Patient Instructions (Signed)
 Medication Instructions:  Your physician recommends that you continue on your current medications as directed. Please refer to the Current Medication list given to you today.  *If you need a refill on your cardiac medications before your next appointment, please call your pharmacy*    Testing/Procedures: Non-Cardiac CT Angiography (CTA) chest/aorta, is a special type of CT scan that uses a computer to produce multi-dimensional views of major blood vessels throughout the body. In CT angiography, a contrast material is injected through an IV to help visualize the blood vessels **To do in June**    Follow-Up: At [facility], you and your health needs are our priority.  As part of our continuing mission to provide you with exceptional heart care, we have created designated Provider Care Teams.  These Care Teams include your primary Cardiologist (physician) and Advanced Practice Providers (APPs -  Physician Assistants and Nurse Practitioners) who all work together to provide you with the care you need, when you need it.  We recommend signing up for the patient portal called "MyChart".  Sign up information is provided on this After Visit Summary.  MyChart is used to connect with patients for Virtual Visits (Telemedicine).  Patients are able to view lab/test results, encounter notes, upcoming appointments, etc.  Non-urgent messages can be sent to your provider as well.   To learn more about what you can do with MyChart, go to [email or web address].    Your next appointment:   12 month(s)  Provider:   Nanetta Batty, MD    Other Instructions   1st Floor: - Lobby - Registration  - Pharmacy  - Lab - Cafe  2nd Floor: - PV Lab - Diagnostic Testing (echo, CT, nuclear med)  3rd Floor: - Vacant  4th Floor: - TCTS (cardiothoracic surgery) - AFib Clinic - Structural Heart Clinic - Vascular Surgery  - Vascular Ultrasound  5th Floor: - HeartCare Cardiology (general and  EP) - Clinical Pharmacy for coumadin, hypertension, lipid, weight-loss medications, and med management appointments    Valet parking services will be available as well.

## 2023-06-26 NOTE — Assessment & Plan Note (Signed)
 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  This will be repeated on an annual basis.

## 2023-06-26 NOTE — Assessment & Plan Note (Signed)
 History of mild hyperlipidemia not on statin therapy with lipid profile performed/18/24 revealing total cholesterol 185, LDL 124 and HDL 41.  Given his mildly elevated coronary calcium score I suggested that he start a low-dose statin which she declined.

## 2023-06-26 NOTE — Progress Notes (Signed)
 06/26/2023 [name]   09/20/1945  [id number]  Primary Physician Daisy Floro, MD Primary Cardiologist: Runell Gess MD [name], [place]  HPI:  Victor Campbell is a 78 y.o. 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  He was a cardiology patient of Dr. Eldridge Dace.  I am assuming his care in Dr. Hoyle Barr absence.  He has no significant cardiac risk factors other than mild untreated hyperlipidemia.  There is no family history of heart disease Victor Campbell is never had a heart attack or stroke.  He denies chest pain or shortness of breath.  He was having palpitations in the past and had an event monitor that showed some short runs of PACs but he no longer symptomatic.  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  He is active and works at [employer] 3 days a week, walks and does tai chi.  He is completely asymptomatic.   Current Meds  Medication Sig   glucosamine-chondroitin 500-400 MG tablet Take by mouth.   levothyroxine (SYNTHROID) 100 MCG tablet Take 100 mcg by mouth every morning.   metroNIDAZOLE (METROCREAM) 0.75 % cream Apply topically 2 (two) times daily.   Multiple Vitamin (MULTI-VITAMIN) tablet Take 1 tablet by mouth daily.   Omega-3 1000 MG CAPS Take by mouth.     Allergies  Allergen Reactions   Penicillins Other (See Comments)    Reaction years ago and doesn't remember.  Has always been told not to take it. Reaction years ago and doesn't remember.  Has always been told not to take it. other     Social History   Socioeconomic History   Marital status: Married    Spouse name: Not on file   Number of children: Not on file   Years of education: Not on file   Highest education level: Not on file  Occupational History   Not on file  Tobacco Use   Smoking status: Former    Types:  Cigarettes    Passive exposure: Never   Smokeless tobacco: Never  Substance and Sexual Activity   Alcohol use: Not on file   Drug use: Not on file   Sexual activity: Not on file  Other Topics Concern   Not on file  Social History Narrative   Not on file   Social Drivers of Health   Financial Resource Strain: Not on file  Food Insecurity: Not on file  Transportation Needs: Not on file  Physical Activity: Not on file  Stress: Not on file  Social Connections: Not on file  Intimate Partner Violence: Not on file     Review of Systems: General: negative for chills, fever, night sweats or weight changes.  Cardiovascular: negative for chest pain, dyspnea on exertion, edema, orthopnea, palpitations, paroxysmal nocturnal dyspnea or shortness of breath Dermatological: negative for rash Respiratory: negative for cough or wheezing Urologic: negative for hematuria Abdominal: negative for nausea, vomiting, diarrhea, bright red blood per rectum, melena, or hematemesis Neurologic: negative for visual changes, syncope, or dizziness All other systems reviewed and are otherwise negative except as noted above.    Blood pressure 138/86, pulse 85, height 5\' 10"  (1.778 m), weight 200 lb (90.7 kg), SpO2 95%.  General appearance: alert and no distress Neck: no adenopathy, no carotid bruit, no JVD, supple, symmetrical, trachea midline, and thyroid not  enlarged, symmetric, no tenderness/mass/nodules Lungs: clear to auscultation bilaterally Heart: regular rate and rhythm, S1, S2 normal, no murmur, click, rub or gallop Extremities: extremities normal, atraumatic, no cyanosis or edema Pulses: 2+ and symmetric Skin: Skin color, texture, turgor normal. No rashes or lesions Neurologic: Grossly normal  EKG EKG Interpretation Date/Time:  Wednesday June 26 2023 13:40:14 EST Ventricular Rate:  84 PR Interval:  164 QRS Duration:  94 QT Interval:  366 QTC Calculation: 432 R Axis:   8  Text  Interpretation: Normal sinus rhythm Normal ECG No previous ECGs available Confirmed by Nanetta Batty [phone]) on 06/26/2023 1:46:19 PM    ASSESSMENT AND PLAN:   Hyperlipidemia History of mild hyperlipidemia not on statin therapy with lipid profile performed/18/24 revealing total cholesterol 185, LDL 124 and HDL 41.  Given his mildly elevated coronary calcium score I suggested that he start a low-dose statin which she declined.  Thoracic aortic aneurysm (HCC) Mild dilatation of ascending thoracic aorta measuring 41 mm by CTA performed 10/10/2022.  This will be repeated on an annual basis.     Runell Gess MD FACP,FACC,FAHA, [facility] 06/26/2023 1:59 PM

## 2023-07-01 IMAGING — CT CT ANGIO CHEST
3 of 8 series · 18 of 46 positions shown · IV contrast (OMNIPAQUE 350)
Comparison: 03/09/2021

CLINICAL DATA: 75-year-old male with ascending aorta aneurysm

EXAM:
CT ANGIOGRAPHY CHEST WITH CONTRAST
TECHNIQUE: Multidetector CT imaging of the chest was performed using the
standard protocol during bolus administration of intravenous
contrast. Multiplanar CT image reconstructions and MIPs were
obtained to evaluate the vascular anatomy.

[Series 4: aorta 3.0 bf37 2 · axial · 0.74mm/px · z∈[-340,-52]mm · 13 of 114 slices shown]
[im 9/114  lung]
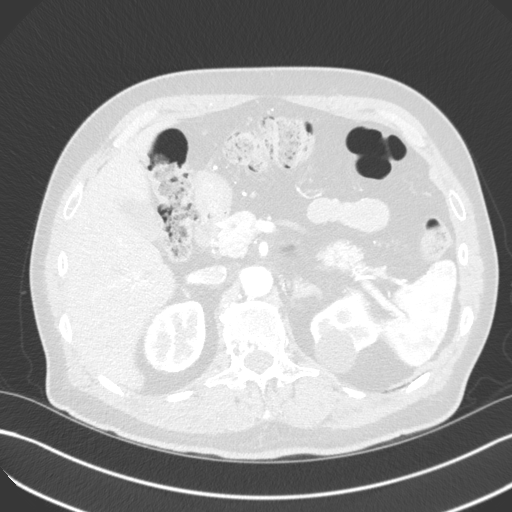
[im 17/114  soft-tissue]
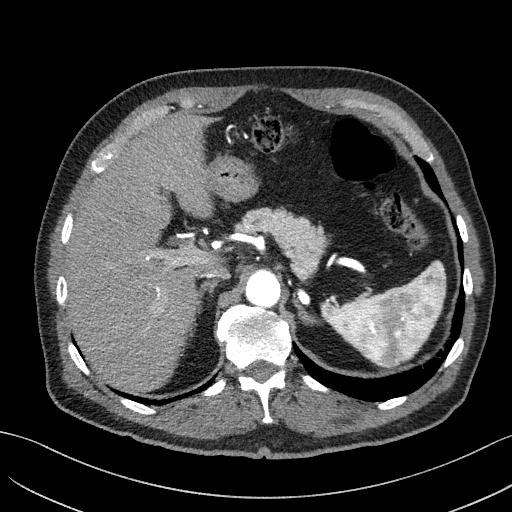
[im 25/114  lung]
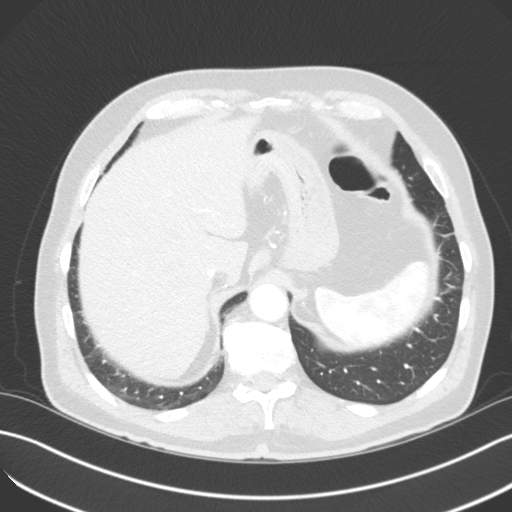
[im 33/114  soft-tissue]
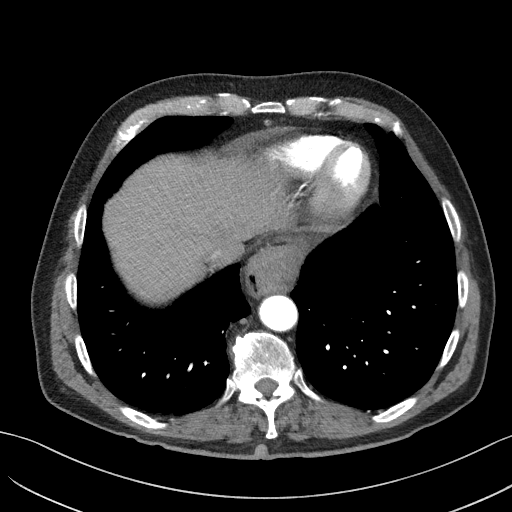
[im 41/114  lung]
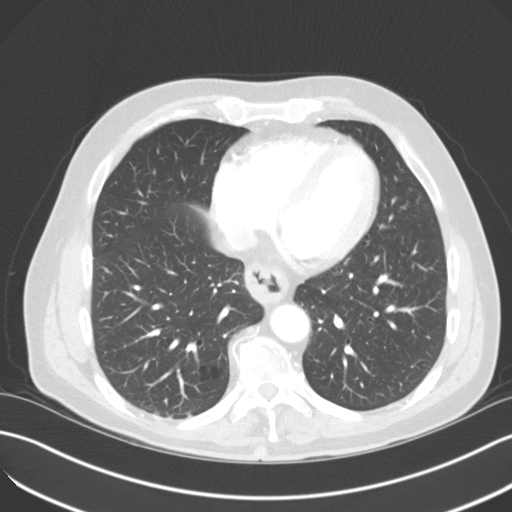
[im 49/114  soft-tissue]
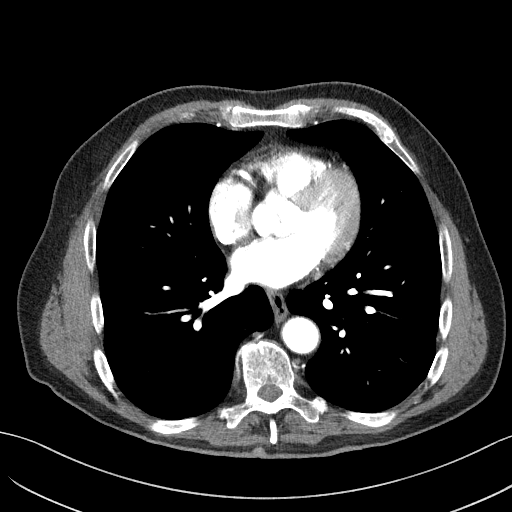
[im 57/114  lung]
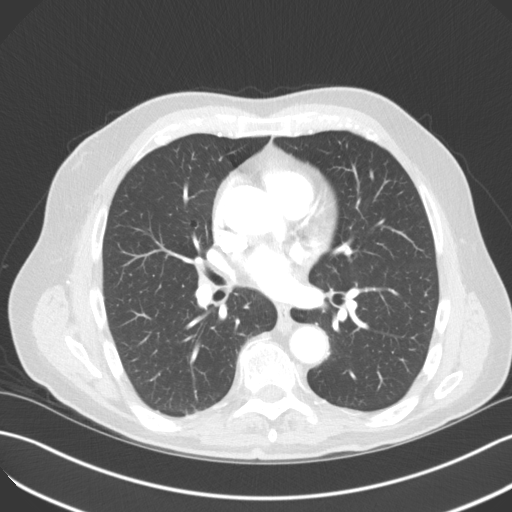
[im 65/114  soft-tissue]
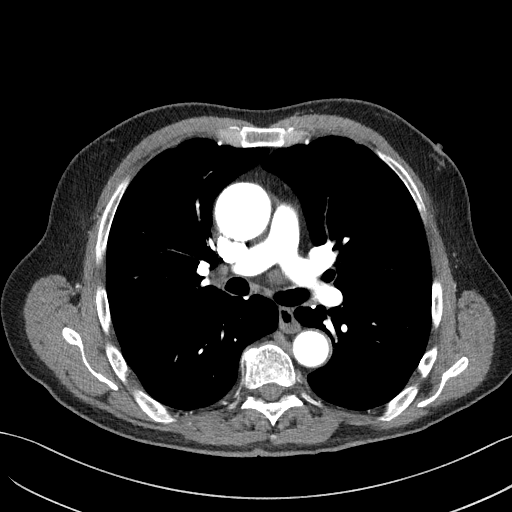
[im 73/114  lung]
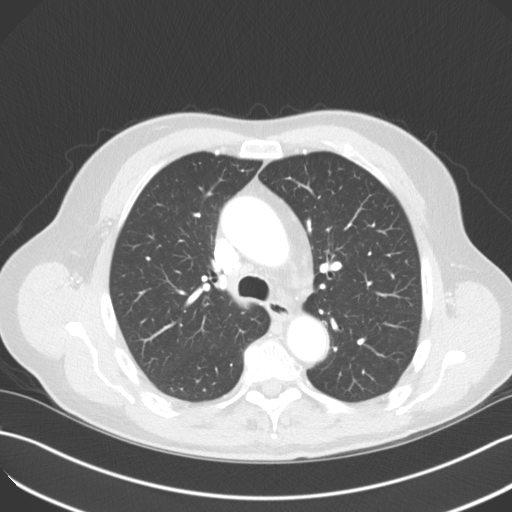
[im 81/114  soft-tissue]
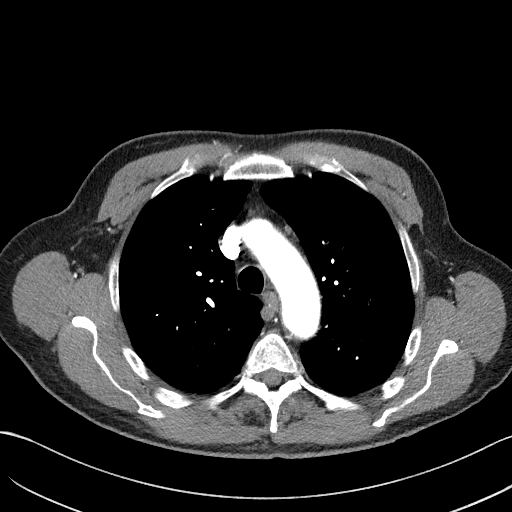
[im 89/114  lung]
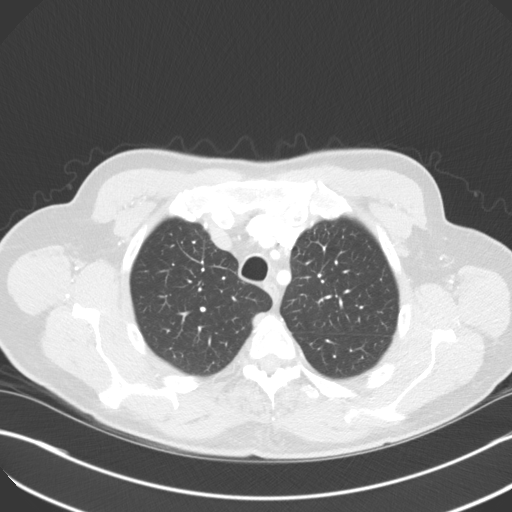
[im 97/114  soft-tissue]
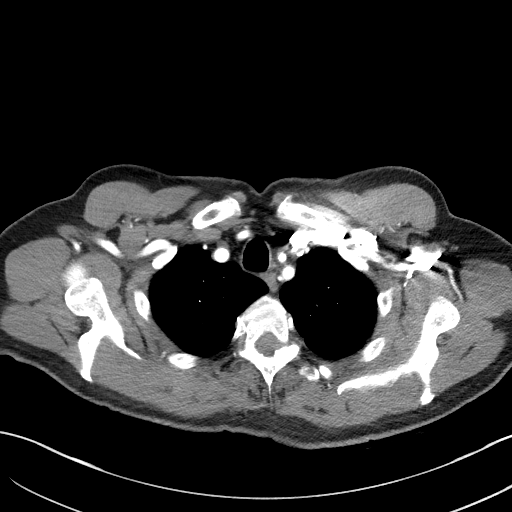
[im 105/114  lung]
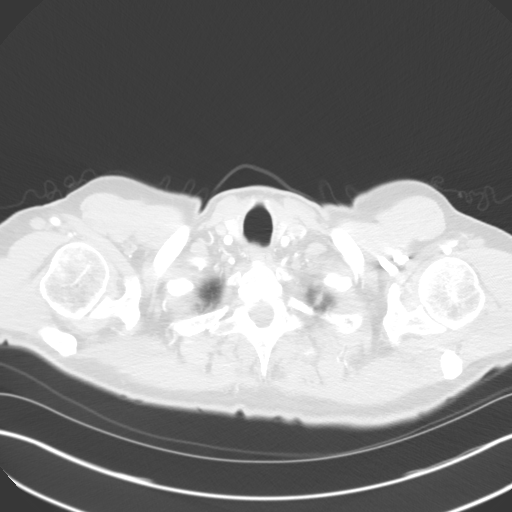

[Series 5: lung · axial · 0.74mm/px · z∈[-340,-292]mm · 2 of 114 slices shown]
[im 9/114  soft-tissue]
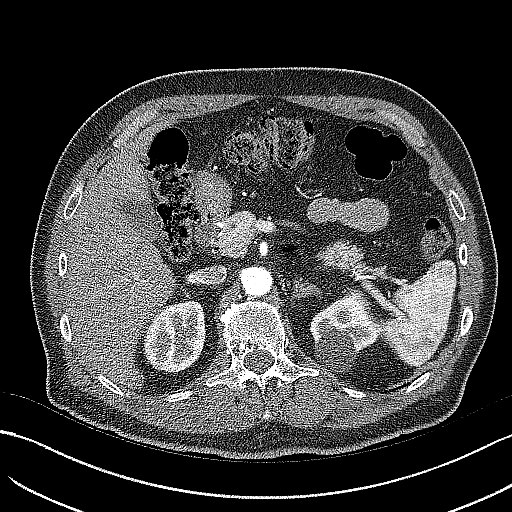
[im 25/114  soft-tissue]
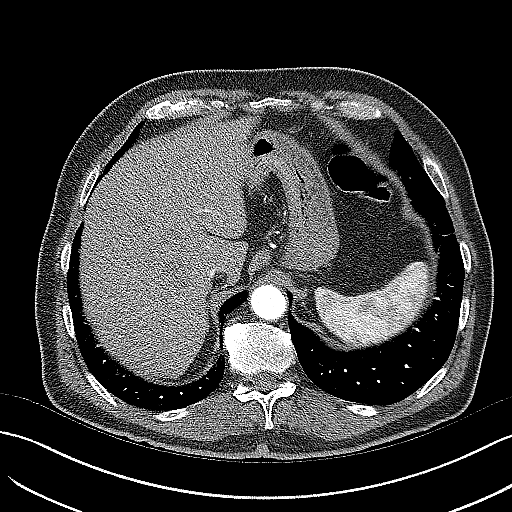

[Series 7: coronals · coronal · 0.66mm/px · 3 of 136 slices shown]
[im 34/136  soft-tissue]
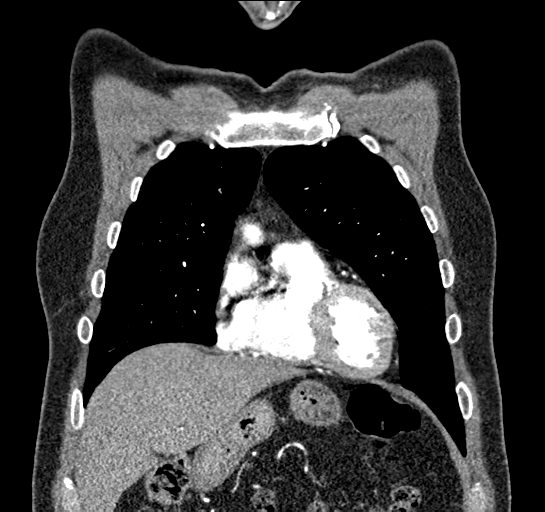
[im 68/136  soft-tissue]
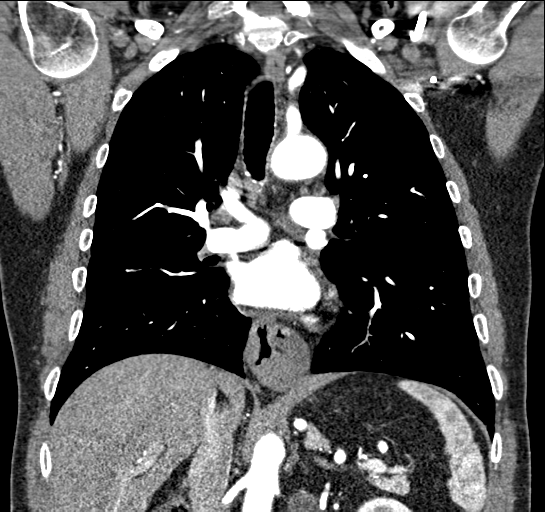
[im 102/136  soft-tissue]
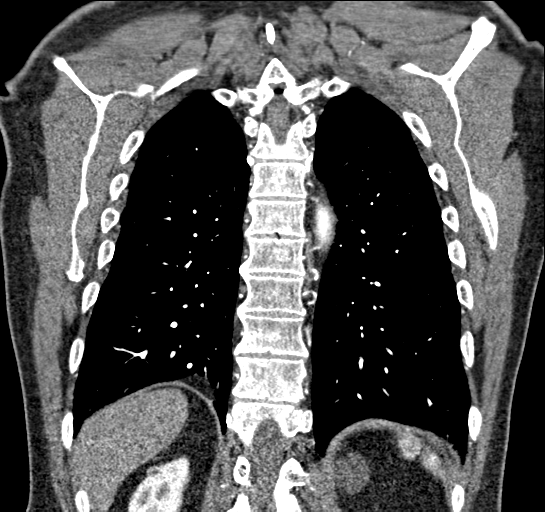

[18 of 46 positions shown; findings below may reference images not displayed]

RADIATION DOSE REDUCTION: This exam was performed according to the
departmental dose-optimization program which includes automated
exposure control, adjustment of the mA and/or kV according to
patient size and/or use of iterative reconstruction technique.

CONTRAST:  100mL OMNIPAQUE IOHEXOL 350 MG/ML SOLN
FINDINGS: Cardiovascular:

Heart:

No cardiomegaly. No pericardial fluid/thickening. No significant
coronary calcifications.

Aorta:

No significant aortic valve calcifications.

Greatest estimated diameter of the aortic annulus 28 mm on the
coronal reformatted images.

Greatest estimated diameter of the [place] junction, 28 mm on
the coronal images.

Greatest estimated diameter of the ascending aorta on the axial
images, 42 mm.

No significant atherosclerotic changes of the aorta. Branch vessels
are patent with a 3 vessel arch. Cervical cerebral vessels patent at
the base of the neck.

No pedunculated plaque, ulcerated plaque, dissection, periaortic
fluid. No wall thickening.

Pulmonary arteries:

Timing of the contrast bolus is not optimized for evaluation of
pulmonary artery filling defects. Unremarkable size of the main
pulmonary artery.

Mediastinum/Nodes: No mediastinal adenopathy.  Hiatal hernia

Unremarkable appearance of the thoracic inlet.

Lungs/Pleura: Central airways are clear. No pleural effusion. No
confluent airspace disease.

No pneumothorax.

Upper Abdomen: No acute finding of the upper abdomen. Incidental
imaging of the renal arteries demonstrates corrugated appearance
compatible with fibromuscular dysplasia. No significant
atherosclerotic changes.

Musculoskeletal: No acute displaced fracture. Degenerative changes
of the spine.

Review of the MIP images confirms the above findings.
IMPRESSION: Greatest estimated diameter of the ascending aorta 4.2 cm. Recommend
annual imaging followup by CTA or MRA. This recommendation follows
0535 ACCF/AHA/AATS/ACR/ASA/SCA/ALLEX/SPAIN/VILAR RODRIGUES/TIRMIZI Guidelines for the
Diagnosis and Management of Patients with Thoracic Aortic Disease.
Circulation. 0535; 121: [id number]. Aortic aneurysm NOS ([id number])

Incidental imaging of the renal arteries demonstrates fibromuscular
dysplasia.

## 2023-08-19 DIAGNOSIS — E039 Hypothyroidism, unspecified: Secondary | ICD-10-CM | POA: Diagnosis not present

## 2023-08-19 DIAGNOSIS — Z79899 Other long term (current) drug therapy: Secondary | ICD-10-CM | POA: Diagnosis not present

## 2023-08-19 DIAGNOSIS — E782 Mixed hyperlipidemia: Secondary | ICD-10-CM | POA: Diagnosis not present

## 2023-08-19 DIAGNOSIS — Z125 Encounter for screening for malignant neoplasm of prostate: Secondary | ICD-10-CM | POA: Diagnosis not present

## 2023-08-20 DIAGNOSIS — E782 Mixed hyperlipidemia: Secondary | ICD-10-CM | POA: Diagnosis not present

## 2023-08-20 DIAGNOSIS — Z0000 Encounter for general adult medical examination without abnormal findings: Secondary | ICD-10-CM | POA: Diagnosis not present

## 2023-08-20 DIAGNOSIS — Z6827 Body mass index (BMI) 27.0-27.9, adult: Secondary | ICD-10-CM | POA: Diagnosis not present

## 2023-08-20 DIAGNOSIS — R030 Elevated blood-pressure reading, without diagnosis of hypertension: Secondary | ICD-10-CM | POA: Diagnosis not present

## 2023-08-20 DIAGNOSIS — Z125 Encounter for screening for malignant neoplasm of prostate: Secondary | ICD-10-CM | POA: Diagnosis not present

## 2023-08-20 DIAGNOSIS — E039 Hypothyroidism, unspecified: Secondary | ICD-10-CM | POA: Diagnosis not present

## 2023-08-20 DIAGNOSIS — L719 Rosacea, unspecified: Secondary | ICD-10-CM | POA: Diagnosis not present

## 2023-10-07 ENCOUNTER — Ambulatory Visit (HOSPITAL_COMMUNITY)
Admission: RE | Admit: 2023-10-07 | Discharge: 2023-10-07 | Disposition: A | Discharge status: Home / Self Care | Source: Ambulatory Visit | Attending: Interventional Cardiology | Admitting: Interventional Cardiology

## 2023-10-07 DIAGNOSIS — I77819 Aortic ectasia, unspecified site: Secondary | ICD-10-CM | POA: Insufficient documentation

## 2023-10-07 DIAGNOSIS — I712 Thoracic aortic aneurysm, without rupture, unspecified: Secondary | ICD-10-CM | POA: Diagnosis not present

## 2023-10-07 DIAGNOSIS — K449 Diaphragmatic hernia without obstruction or gangrene: Secondary | ICD-10-CM | POA: Diagnosis not present

## 2023-10-07 DIAGNOSIS — K5710 Diverticulosis of small intestine without perforation or abscess without bleeding: Secondary | ICD-10-CM | POA: Diagnosis not present

## 2023-10-07 DIAGNOSIS — I7121 Aneurysm of the ascending aorta, without rupture: Secondary | ICD-10-CM | POA: Diagnosis not present

## 2023-10-07 DIAGNOSIS — N281 Cyst of kidney, acquired: Secondary | ICD-10-CM | POA: Diagnosis not present

## 2023-10-07 MED ORDER — SODIUM CHLORIDE (PF) 0.9 % IJ SOLN
INTRAMUSCULAR | Status: AC
  Filled 2023-10-07: qty 50

## 2023-10-07 MED ORDER — IOHEXOL 350 MG/ML SOLN
75.0000 mL | Freq: Once | INTRAVENOUS | Status: AC | PRN
Start: 2023-10-07 — End: 2023-10-07
  Administered 2023-10-07: 75 mL via INTRAVENOUS

## 2023-11-03 ENCOUNTER — Ambulatory Visit: Payer: Self-pay | Admitting: Cardiology

## 2024-03-03 DIAGNOSIS — H903 Sensorineural hearing loss, bilateral: Secondary | ICD-10-CM | POA: Diagnosis not present

## 2024-05-05 ENCOUNTER — Encounter: Payer: Self-pay | Admitting: Cardiovascular Disease
